# Patient Record
Sex: Male | Born: 1990 | Race: White | Hispanic: No | Marital: Single | Smoking: Never smoker
Health system: Southern US, Community
[De-identification: ages and names within clinical notes are randomized; demographics above are authoritative.]

## PROBLEM LIST (undated history)

## (undated) DIAGNOSIS — I10 Essential (primary) hypertension: Secondary | ICD-10-CM

## (undated) DIAGNOSIS — E7800 Pure hypercholesterolemia, unspecified: Secondary | ICD-10-CM

## (undated) DIAGNOSIS — E119 Type 2 diabetes mellitus without complications: Secondary | ICD-10-CM

---

## 2019-08-01 ENCOUNTER — Other Ambulatory Visit: Payer: Self-pay | Admitting: Family Medicine

## 2019-08-01 ENCOUNTER — Ambulatory Visit
Admission: RE | Admit: 2019-08-01 | Discharge: 2019-08-01 | Disposition: A | Discharge status: Home / Self Care | Payer: Commercial Managed Care - PPO | Source: Ambulatory Visit | Attending: Family Medicine | Admitting: Family Medicine

## 2019-08-01 DIAGNOSIS — M542 Cervicalgia: Secondary | ICD-10-CM

## 2020-09-06 DIAGNOSIS — I10 Essential (primary) hypertension: Secondary | ICD-10-CM | POA: Diagnosis not present

## 2020-09-06 DIAGNOSIS — Z8249 Family history of ischemic heart disease and other diseases of the circulatory system: Secondary | ICD-10-CM | POA: Diagnosis not present

## 2020-09-06 DIAGNOSIS — E7800 Pure hypercholesterolemia, unspecified: Secondary | ICD-10-CM | POA: Diagnosis not present

## 2020-09-06 DIAGNOSIS — Z23 Encounter for immunization: Secondary | ICD-10-CM | POA: Diagnosis not present

## 2020-09-06 DIAGNOSIS — R7303 Prediabetes: Secondary | ICD-10-CM | POA: Diagnosis not present

## 2020-09-12 DIAGNOSIS — E1165 Type 2 diabetes mellitus with hyperglycemia: Secondary | ICD-10-CM | POA: Diagnosis not present

## 2020-12-11 DIAGNOSIS — E1165 Type 2 diabetes mellitus with hyperglycemia: Secondary | ICD-10-CM | POA: Diagnosis not present

## 2020-12-11 DIAGNOSIS — Z8249 Family history of ischemic heart disease and other diseases of the circulatory system: Secondary | ICD-10-CM | POA: Diagnosis not present

## 2020-12-11 DIAGNOSIS — I10 Essential (primary) hypertension: Secondary | ICD-10-CM | POA: Diagnosis not present

## 2020-12-24 DIAGNOSIS — E1165 Type 2 diabetes mellitus with hyperglycemia: Secondary | ICD-10-CM | POA: Diagnosis not present

## 2020-12-24 DIAGNOSIS — Z111 Encounter for screening for respiratory tuberculosis: Secondary | ICD-10-CM | POA: Diagnosis not present

## 2021-03-07 DIAGNOSIS — E7800 Pure hypercholesterolemia, unspecified: Secondary | ICD-10-CM | POA: Diagnosis not present

## 2021-03-07 DIAGNOSIS — I10 Essential (primary) hypertension: Secondary | ICD-10-CM | POA: Diagnosis not present

## 2021-03-07 DIAGNOSIS — E1165 Type 2 diabetes mellitus with hyperglycemia: Secondary | ICD-10-CM | POA: Diagnosis not present

## 2021-03-07 DIAGNOSIS — Z0000 Encounter for general adult medical examination without abnormal findings: Secondary | ICD-10-CM | POA: Diagnosis not present

## 2021-07-26 DIAGNOSIS — M545 Low back pain, unspecified: Secondary | ICD-10-CM | POA: Diagnosis not present

## 2021-09-11 DIAGNOSIS — E1165 Type 2 diabetes mellitus with hyperglycemia: Secondary | ICD-10-CM | POA: Diagnosis not present

## 2021-09-11 DIAGNOSIS — M5441 Lumbago with sciatica, right side: Secondary | ICD-10-CM | POA: Diagnosis not present

## 2021-09-11 DIAGNOSIS — E7800 Pure hypercholesterolemia, unspecified: Secondary | ICD-10-CM | POA: Diagnosis not present

## 2021-09-11 DIAGNOSIS — I10 Essential (primary) hypertension: Secondary | ICD-10-CM | POA: Diagnosis not present

## 2022-03-10 DIAGNOSIS — E7800 Pure hypercholesterolemia, unspecified: Secondary | ICD-10-CM | POA: Diagnosis not present

## 2022-03-10 DIAGNOSIS — I10 Essential (primary) hypertension: Secondary | ICD-10-CM | POA: Diagnosis not present

## 2022-03-10 DIAGNOSIS — E1165 Type 2 diabetes mellitus with hyperglycemia: Secondary | ICD-10-CM | POA: Diagnosis not present

## 2022-03-10 DIAGNOSIS — Z0000 Encounter for general adult medical examination without abnormal findings: Secondary | ICD-10-CM | POA: Diagnosis not present

## 2022-03-10 DIAGNOSIS — M5441 Lumbago with sciatica, right side: Secondary | ICD-10-CM | POA: Diagnosis not present

## 2022-03-11 ENCOUNTER — Other Ambulatory Visit: Payer: Self-pay | Admitting: Family Medicine

## 2022-03-11 ENCOUNTER — Other Ambulatory Visit (HOSPITAL_COMMUNITY): Payer: Self-pay | Admitting: Family Medicine

## 2022-03-11 DIAGNOSIS — Z8249 Family history of ischemic heart disease and other diseases of the circulatory system: Secondary | ICD-10-CM

## 2022-03-11 DIAGNOSIS — E7800 Pure hypercholesterolemia, unspecified: Secondary | ICD-10-CM

## 2022-03-17 ENCOUNTER — Ambulatory Visit (HOSPITAL_BASED_OUTPATIENT_CLINIC_OR_DEPARTMENT_OTHER)
Admission: RE | Admit: 2022-03-17 | Discharge: 2022-03-17 | Disposition: A | Discharge status: Home / Self Care | Payer: Commercial Managed Care - PPO | Source: Ambulatory Visit | Attending: Family Medicine | Admitting: Family Medicine

## 2022-03-17 ENCOUNTER — Encounter (HOSPITAL_BASED_OUTPATIENT_CLINIC_OR_DEPARTMENT_OTHER): Payer: Self-pay

## 2022-03-17 DIAGNOSIS — Z8249 Family history of ischemic heart disease and other diseases of the circulatory system: Secondary | ICD-10-CM | POA: Insufficient documentation

## 2022-03-17 DIAGNOSIS — E7800 Pure hypercholesterolemia, unspecified: Secondary | ICD-10-CM | POA: Insufficient documentation

## 2022-04-13 DIAGNOSIS — G4733 Obstructive sleep apnea (adult) (pediatric): Secondary | ICD-10-CM | POA: Diagnosis not present

## 2022-05-20 DIAGNOSIS — G4733 Obstructive sleep apnea (adult) (pediatric): Secondary | ICD-10-CM | POA: Diagnosis not present

## 2022-06-20 DIAGNOSIS — G4733 Obstructive sleep apnea (adult) (pediatric): Secondary | ICD-10-CM | POA: Diagnosis not present

## 2022-07-20 DIAGNOSIS — G4733 Obstructive sleep apnea (adult) (pediatric): Secondary | ICD-10-CM | POA: Diagnosis not present

## 2022-07-21 DIAGNOSIS — G4733 Obstructive sleep apnea (adult) (pediatric): Secondary | ICD-10-CM | POA: Diagnosis not present

## 2022-08-05 DIAGNOSIS — M5459 Other low back pain: Secondary | ICD-10-CM | POA: Diagnosis not present

## 2022-08-20 DIAGNOSIS — G4733 Obstructive sleep apnea (adult) (pediatric): Secondary | ICD-10-CM | POA: Diagnosis not present

## 2022-08-31 DIAGNOSIS — I10 Essential (primary) hypertension: Secondary | ICD-10-CM | POA: Diagnosis not present

## 2022-08-31 DIAGNOSIS — G4733 Obstructive sleep apnea (adult) (pediatric): Secondary | ICD-10-CM | POA: Diagnosis not present

## 2022-09-23 DIAGNOSIS — E1165 Type 2 diabetes mellitus with hyperglycemia: Secondary | ICD-10-CM | POA: Diagnosis not present

## 2022-09-23 DIAGNOSIS — I10 Essential (primary) hypertension: Secondary | ICD-10-CM | POA: Diagnosis not present

## 2022-09-23 DIAGNOSIS — E7800 Pure hypercholesterolemia, unspecified: Secondary | ICD-10-CM | POA: Diagnosis not present

## 2022-09-23 DIAGNOSIS — G4733 Obstructive sleep apnea (adult) (pediatric): Secondary | ICD-10-CM | POA: Diagnosis not present

## 2022-12-02 DIAGNOSIS — E1165 Type 2 diabetes mellitus with hyperglycemia: Secondary | ICD-10-CM | POA: Diagnosis not present

## 2023-01-20 ENCOUNTER — Emergency Department (HOSPITAL_COMMUNITY)
Admission: EM | Admit: 2023-01-20 | Discharge: 2023-01-20 | Disposition: A | Discharge status: Home / Self Care | Payer: BC Managed Care – PPO | Attending: Emergency Medicine | Admitting: Emergency Medicine

## 2023-01-20 ENCOUNTER — Emergency Department (HOSPITAL_COMMUNITY): Payer: BC Managed Care – PPO

## 2023-01-20 ENCOUNTER — Other Ambulatory Visit: Payer: Self-pay

## 2023-01-20 ENCOUNTER — Encounter (HOSPITAL_COMMUNITY): Payer: Self-pay

## 2023-01-20 DIAGNOSIS — N23 Unspecified renal colic: Secondary | ICD-10-CM | POA: Insufficient documentation

## 2023-01-20 DIAGNOSIS — R1031 Right lower quadrant pain: Secondary | ICD-10-CM | POA: Diagnosis not present

## 2023-01-20 DIAGNOSIS — R109 Unspecified abdominal pain: Secondary | ICD-10-CM | POA: Diagnosis not present

## 2023-01-20 DIAGNOSIS — N201 Calculus of ureter: Secondary | ICD-10-CM | POA: Diagnosis not present

## 2023-01-20 HISTORY — DX: Type 2 diabetes mellitus without complications: E11.9

## 2023-01-20 HISTORY — DX: Essential (primary) hypertension: I10

## 2023-01-20 HISTORY — DX: Pure hypercholesterolemia, unspecified: E78.00

## 2023-01-20 LAB — COMPREHENSIVE METABOLIC PANEL
ALT: 30 U/L (ref 0–44)
AST: 24 U/L (ref 15–41)
Albumin: 3.8 g/dL (ref 3.5–5.0)
Alkaline Phosphatase: 99 U/L (ref 38–126)
Anion gap: 12 (ref 5–15)
BUN: 12 mg/dL (ref 6–20)
CO2: 26 mmol/L (ref 22–32)
Calcium: 9.1 mg/dL (ref 8.9–10.3)
Chloride: 98 mmol/L (ref 98–111)
Creatinine, Ser: 1.18 mg/dL (ref 0.61–1.24)
GFR, Estimated: >60 mL/min (ref >60)
Glucose, Bld: 228 mg/dL — ABNORMAL HIGH (ref 70–99)
Potassium: 4.1 mmol/L (ref 3.5–5.1)
Sodium: 136 mmol/L (ref 135–145)
Total Bilirubin: 0.9 mg/dL (ref 0.3–1.2)
Total Protein: 6.9 g/dL (ref 6.5–8.1)

## 2023-01-20 LAB — URINALYSIS, W/ REFLEX TO CULTURE (INFECTION SUSPECTED)
Bacteria, UA: NONE SEEN
Bilirubin Urine: NEGATIVE
Glucose, UA: >=500 mg/dL — AB
Hgb urine dipstick: NEGATIVE
Ketones, ur: 5 mg/dL — AB
Leukocytes,Ua: NEGATIVE
Nitrite: NEGATIVE
Protein, ur: NEGATIVE mg/dL
Specific Gravity, Urine: 1.02 (ref 1.005–1.030)
pH: 8 (ref 5.0–8.0)

## 2023-01-20 LAB — CBC
HCT: 40.6 % (ref 39.0–52.0)
Hemoglobin: 13.9 g/dL (ref 13.0–17.0)
MCH: 27.2 pg (ref 26.0–34.0)
MCHC: 34.2 g/dL (ref 30.0–36.0)
MCV: 79.5 fL — ABNORMAL LOW (ref 80.0–100.0)
Platelets: 274 10*3/uL (ref 150–400)
RBC: 5.11 MIL/uL (ref 4.22–5.81)
RDW: 13.6 % (ref 11.5–15.5)
WBC: 12.3 10*3/uL — ABNORMAL HIGH (ref 4.0–10.5)
nRBC: 0 % (ref 0.0–0.2)

## 2023-01-20 MED ORDER — MORPHINE SULFATE (PF) 2 MG/ML IV SOLN
2.0000 mg | Freq: Once | INTRAVENOUS | Status: AC
  Administered 2023-01-20: 2 mg via INTRAVENOUS
  Filled 2023-01-20: qty 1

## 2023-01-20 MED ORDER — TAMSULOSIN HCL 0.4 MG PO CAPS: 0.4000 mg | ORAL_CAPSULE | Freq: Every day | ORAL | 0 refills | Status: AC

## 2023-01-20 MED ORDER — OXYCODONE-ACETAMINOPHEN 5-325 MG PO TABS: 1.0000 | ORAL_TABLET | Freq: Four times a day (QID) | ORAL | 0 refills | Status: AC | PRN

## 2023-01-20 MED ORDER — ONDANSETRON HCL 8 MG PO TABS: 8.0000 mg | ORAL_TABLET | Freq: Three times a day (TID) | ORAL | 0 refills | Status: AC | PRN

## 2023-01-20 MED ORDER — ONDANSETRON HCL 4 MG/2ML IJ SOLN
4.0000 mg | Freq: Once | INTRAMUSCULAR | Status: AC
  Administered 2023-01-20: 4 mg via INTRAVENOUS
  Filled 2023-01-20: qty 2

## 2023-01-20 MED ORDER — KETOROLAC TROMETHAMINE 30 MG/ML IJ SOLN
30.0000 mg | Freq: Once | INTRAMUSCULAR | Status: AC
  Administered 2023-01-20: 30 mg via INTRAVENOUS
  Filled 2023-01-20: qty 1

## 2023-01-20 NOTE — ED Provider Notes (Signed)
[facility] Provider Note   CSN: [id number] Arrival date & time: 01/20/23  [id number]     History  Chief Complaint  Patient presents with   Abdominal Pain    Samuel Welch is a 32 y.o. male.  HPI   32 year old male presents today complaining of right lower quadrant pain and.  He states that it began yesterday around 11:00.  He had some nausea and vomiting afterwards.  He reports that he did eat his lunch.  He denies any pain or blood in urine.  He has continued to have pain in the right lower quadrant that has been fairly steady.  He states that he wants to lay in 1 position but is not comfortable and moves around.  He has not had any similar symptoms in the past.  He denies fever, chills, diarrhea  Home Medications Prior to Admission medications   Medication Sig Start Date End Date Taking? Authorizing Provider  ondansetron (ZOFRAN) 8 MG tablet Take 1 tablet (8 mg total) by mouth every 8 (eight) hours as needed for nausea or vomiting. 01/20/23  Yes Pattricia Boss, MD  oxyCODONE-acetaminophen (PERCOCET/ROXICET) 5-325 MG tablet Take 1 tablet by mouth every 6 (six) hours as needed for severe pain. 01/20/23  Yes Pattricia Boss, MD  tamsulosin (FLOMAX) 0.4 MG CAPS capsule Take 1 capsule (0.4 mg total) by mouth daily. 01/20/23  Yes Pattricia Boss, MD      Allergies    Patient has no known allergies.    Review of Systems   Review of Systems  Physical Exam Updated Vital Signs BP 130/67   Pulse 80   Temp 98.4 F (36.9 C) (Oral)   Resp 18   Ht 1.803 m (5\' 11" )   Wt (!) 145.2 kg   SpO2 96%   BMI 44.63 kg/m  Physical Exam Vitals and nursing note reviewed.  Constitutional:      Appearance: He is well-developed.  HENT:     Head: Normocephalic.     Mouth/Throat:     Mouth: Mucous membranes are moist.  Cardiovascular:     Rate and Rhythm: Normal rate and regular rhythm.     Heart sounds: Normal heart sounds.  Pulmonary:     Effort: Pulmonary  effort is normal.     Breath sounds: Normal breath sounds.  Abdominal:     General: Abdomen is flat. Bowel sounds are decreased.     Palpations: Abdomen is soft.     Tenderness: There is abdominal tenderness in the right lower quadrant.  Genitourinary:    Penis: Normal.      Testes:        Right: Tenderness present. Mass or swelling not present.        Left: Mass, tenderness or swelling not present.  Skin:    General: Skin is warm and dry.     Capillary Refill: Capillary refill takes less than 2 seconds.  Neurological:     General: No focal deficit present.     Mental Status: He is alert.     ED Results / Procedures / Treatments   Labs (all labs ordered are listed, but only abnormal results are displayed) Labs Reviewed  URINALYSIS, W/ REFLEX TO CULTURE (INFECTION SUSPECTED) - Abnormal; Notable for the following components:      Result Value   Glucose, UA >=500 (*)    Ketones, ur 5 (*)    All other components within normal limits  CBC - Abnormal; Notable for the  following components:   WBC 12.3 (*)    MCV 79.5 (*)    All other components within normal limits  COMPREHENSIVE METABOLIC PANEL - Abnormal; Notable for the following components:   Glucose, Bld 228 (*)    All other components within normal limits    EKG None  Radiology CT ABDOMEN PELVIS WO CONTRAST  Result Date: 01/20/2023 CLINICAL DATA:  Right lower quadrant abdominal pain. Nausea and vomiting last 24 hours EXAM: CT ABDOMEN AND PELVIS WITHOUT CONTRAST TECHNIQUE: Multidetector CT imaging of the abdomen and pelvis was performed following the standard protocol without IV contrast. RADIATION DOSE REDUCTION: This exam was performed according to the departmental dose-optimization program which includes automated exposure control, adjustment of the mA and/or kV according to patient size and/or use of iterative reconstruction technique. COMPARISON:  None Available. FINDINGS: Lower chest: Lung bases are grossly clear.  No  pleural effusion. Hepatobiliary: On this non IV contrast exam note is made of diffuse fatty liver infiltration. Gallbladder is nondilated. Pancreas: Unremarkable. No pancreatic ductal dilatation or surrounding inflammatory changes. Spleen: Normal in size without focal abnormality. Adrenals/Urinary Tract: The adrenal glands are preserved. No abnormal calcifications seen within the left kidney nor along the course of the left ureter. The right kidney is slightly enlarged with some adjacent stranding and mild collecting system dilatation down to the level of the mid pelvic ureter where there is a 4 mm ureteral stone. No additional ureteral stones. There are 2 separate punctate intrarenal collecting system stones on the right. Preserved contour to the underdistended urinary bladder. Stomach/Bowel: On this non oral contrast exam, the large bowel has a normal course and caliber with scattered stool. Normal appendix. Stomach and small bowel is nondilated. Vascular/Lymphatic: No significant vascular findings are present. No enlarged abdominal or pelvic lymph nodes. Reproductive: Prostate is unremarkable. Other: Small fat containing right inguinal hernia. Slight rectus muscle diastasis with protuberance of fat at the umbilicus. Musculoskeletal: Mild degenerative changes along the spine and pelvis. There is a transitional lumbosacral segment. Posterior osteophytes seen along lower lumbar spine with some canal encroachment IMPRESSION: 4 mm distal right ureteral stone with mild collecting system dilatation. 2 separate additional intrarenal right-sided stones. Fatty liver infiltration. Normal appendix Electronically Signed   By: Jill Side M.D.   On: 01/20/2023 09:48    Procedures Procedures    Medications Ordered in ED Medications  ketorolac (TORADOL) 30 MG/ML injection 30 mg (has no administration in time range)  morphine (PF) 2 MG/ML injection 2 mg (2 mg Intravenous Given 01/20/23 0817)  ondansetron (ZOFRAN)  injection 4 mg (4 mg Intravenous Given 01/20/23 0817)    ED Course/ Medical Decision Making/ A&P Clinical Course as of 01/20/23 1008  Wed Jan 20, 2023  0905 [facility] reviewed interpreted significant for mild leukocytosis of 12,300 [DR]  [id number] metabolic panel reviewed interpreted significant for mild hyperglycemia with glucose of 228 with patient with known diabetes [DR]  0959 CT reviewed interpreted significant for right ureteral stone and 2 right intra renal stones. Radiologist interpretation notes 4 mm distal right ureteral stone with mild collecting system dilatation [DR]    Clinical Course User Index [DR] Pattricia Boss, MD                             Medical Decision Making Amount and/or Complexity of Data Reviewed Labs: ordered. Radiology: ordered.  Risk Prescription drug management.  32 year old male presents today complaining of right lower quadrant pain. Differential diagnosis  includes but is not limited to appendicitis, UTI, colitis, renal colic, testicular torsion, epididymitis, With some pain improvement here in the ED after morphine. CT reviewed and ureteral stone noted. Treated with Toradol We have discussed adequate hydration pain management, return precautions and need for follow-up        Final Clinical Impression(s) / ED Diagnoses Final diagnoses:  Right ureteral stone  Ureteral colic    Rx / DC Orders ED Discharge Orders          Ordered    oxyCODONE-acetaminophen (PERCOCET/ROXICET) 5-325 MG tablet  Every 6 hours PRN        01/20/23 1005    ondansetron (ZOFRAN) 8 MG tablet  Every 8 hours PRN        01/20/23 1005    tamsulosin (FLOMAX) 0.4 MG CAPS capsule  Daily        01/20/23 1005              Pattricia Boss, MD 01/20/23 1008

## 2023-01-20 NOTE — ED Triage Notes (Signed)
Pt present to ED with c/o abdominal pain, n/v times 24hrs.

## 2023-01-25 DIAGNOSIS — N201 Calculus of ureter: Secondary | ICD-10-CM | POA: Diagnosis not present

## 2023-03-15 DIAGNOSIS — Z0000 Encounter for general adult medical examination without abnormal findings: Secondary | ICD-10-CM | POA: Diagnosis not present

## 2023-03-31 DIAGNOSIS — E7800 Pure hypercholesterolemia, unspecified: Secondary | ICD-10-CM | POA: Diagnosis not present

## 2023-03-31 DIAGNOSIS — E1165 Type 2 diabetes mellitus with hyperglycemia: Secondary | ICD-10-CM | POA: Diagnosis not present

## 2023-03-31 DIAGNOSIS — I10 Essential (primary) hypertension: Secondary | ICD-10-CM | POA: Diagnosis not present

## 2023-05-07 DIAGNOSIS — E119 Type 2 diabetes mellitus without complications: Secondary | ICD-10-CM | POA: Diagnosis not present

## 2023-05-07 DIAGNOSIS — H35033 Hypertensive retinopathy, bilateral: Secondary | ICD-10-CM | POA: Diagnosis not present

## 2023-07-21 DIAGNOSIS — G4733 Obstructive sleep apnea (adult) (pediatric): Secondary | ICD-10-CM | POA: Diagnosis not present

## 2023-09-13 DIAGNOSIS — Z23 Encounter for immunization: Secondary | ICD-10-CM | POA: Diagnosis not present

## 2023-09-13 DIAGNOSIS — I10 Essential (primary) hypertension: Secondary | ICD-10-CM | POA: Diagnosis not present

## 2023-09-13 DIAGNOSIS — E7800 Pure hypercholesterolemia, unspecified: Secondary | ICD-10-CM | POA: Diagnosis not present

## 2023-09-13 DIAGNOSIS — G4733 Obstructive sleep apnea (adult) (pediatric): Secondary | ICD-10-CM | POA: Diagnosis not present

## 2023-09-13 DIAGNOSIS — E119 Type 2 diabetes mellitus without complications: Secondary | ICD-10-CM | POA: Diagnosis not present

## 2023-10-20 IMAGING — CT CT CARDIAC CORONARY ARTERY CALCIUM SCORE
3 series · 14 of 20 positions shown, 16 images · non-contrast
Comparison: None.
COMPARISON: None.

Addendum:
CLINICAL DATA: Hyperlipidemia and hypertension.  Former smoker.
CLINICAL DATA: Cardiovascular Disease Risk stratification

EXAM:
Coronary Calcium Score
TECHNIQUE: A gated, non-contrast computed tomography scan of the heart was
performed using 3mm slice thickness. Axial images were analyzed on a
dedicated workstation. Calcium scoring of the coronary arteries was
performed using the Agatston method.

[Series 2: ax lung · axial · 0.98mm/px · z∈[+70,+182]mm · 5 of 86 slices shown]
[im 15/86  lung]
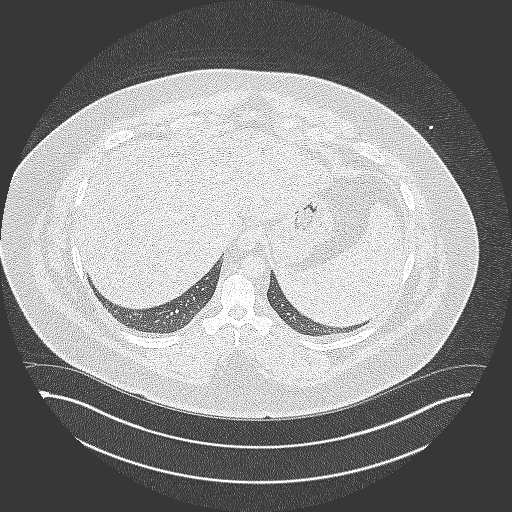
[im 29/86  lung]
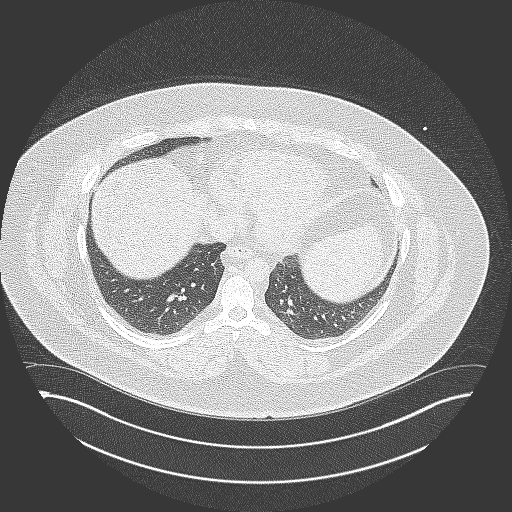
[im 43/86  lung]
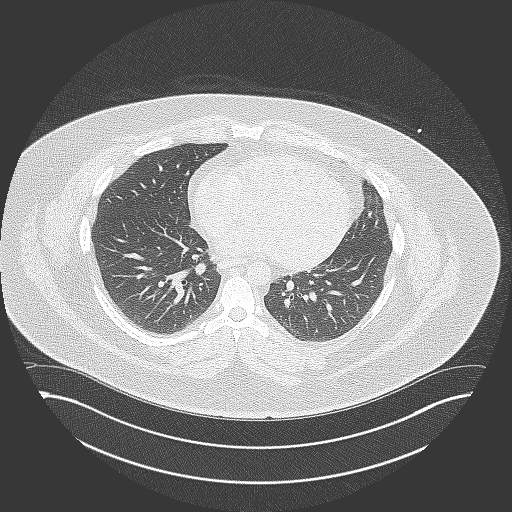
[im 57/86  lung]
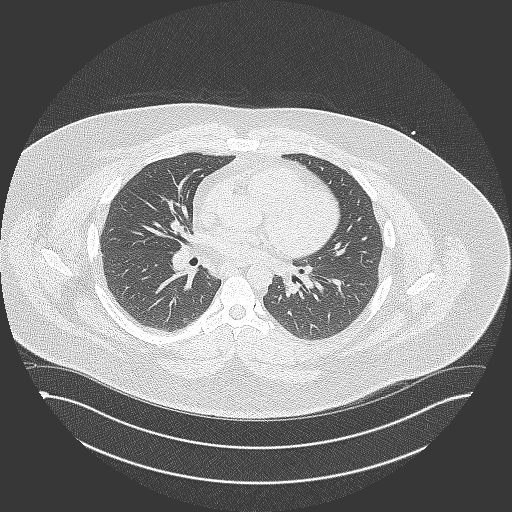
[im 71/86  lung]
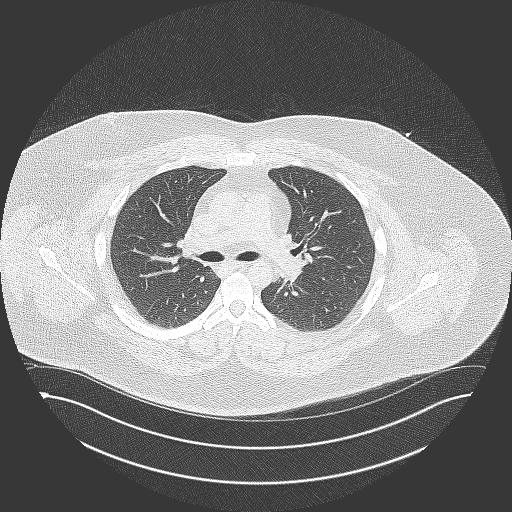

[Series 3: cascseq 3.0 sa36 70% (id) · axial · 0.40mm/px · z∈[+85,+169]mm · 3 of 57 slices shown]
[im 15/57  vessel]
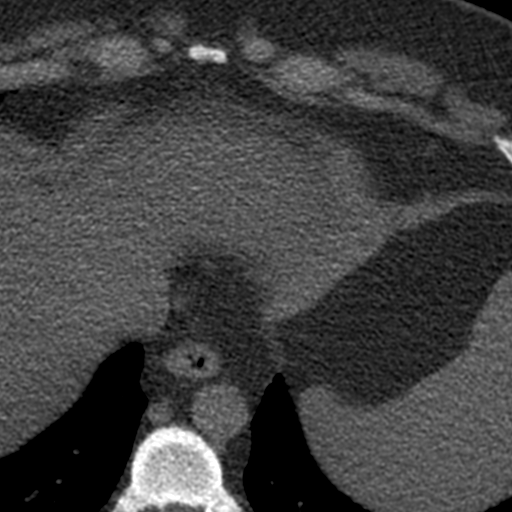
[im 29/57  vessel]
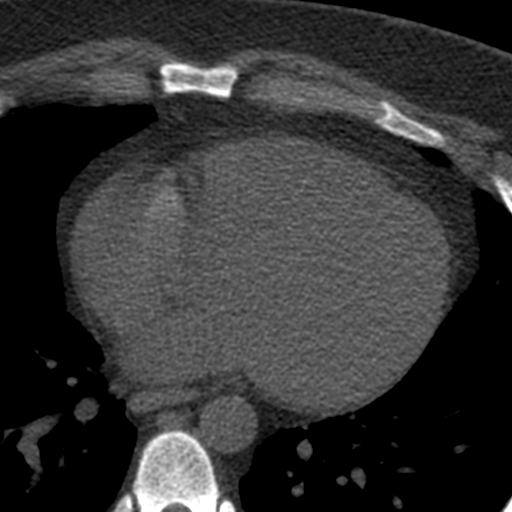
[im 43/57  vessel]
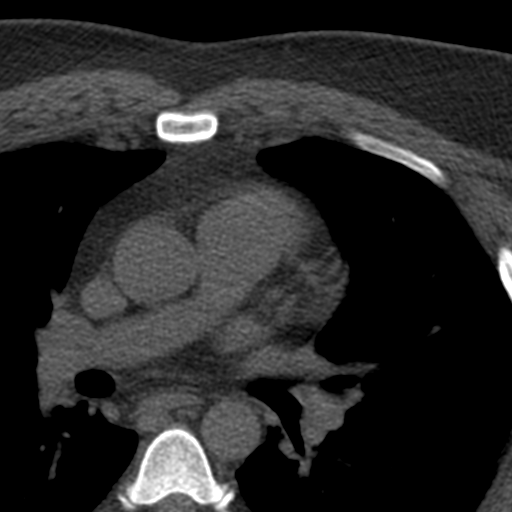

[Series 4: ax st · axial · 0.98mm/px · z∈[+66,+186]mm · 6 of 86 slices shown, 8 images]
[im 13/86  vessel]
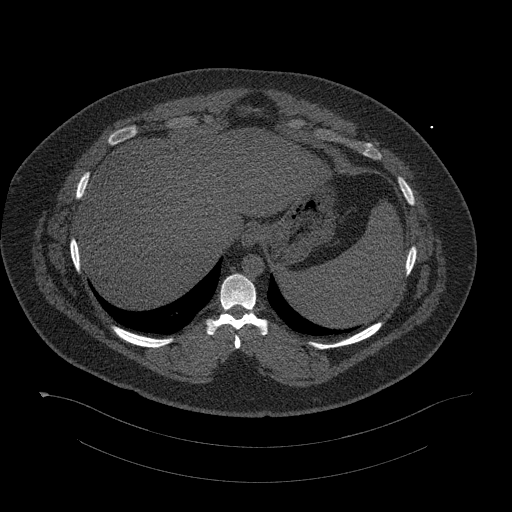
[im 13/86  lung]
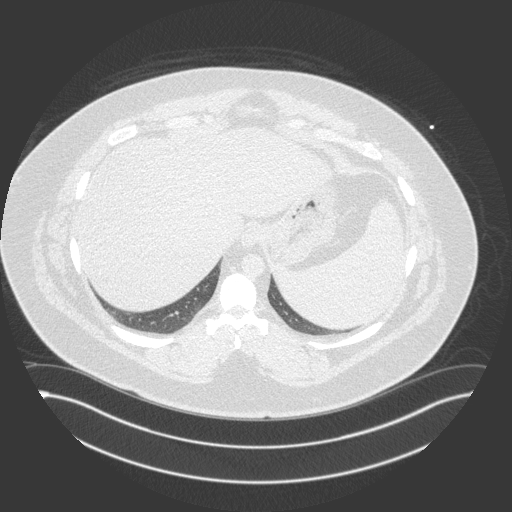
[im 25/86  vessel]
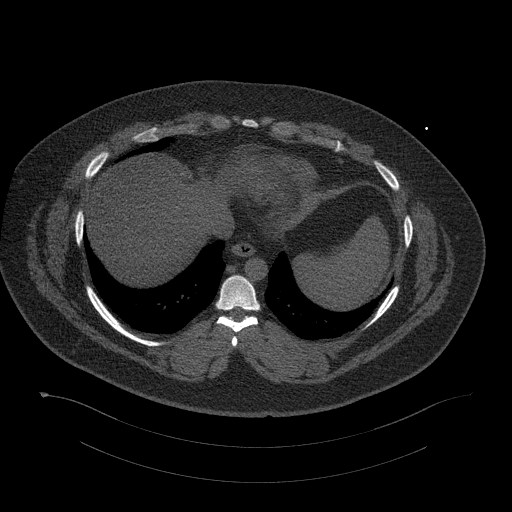
[im 37/86  vessel]
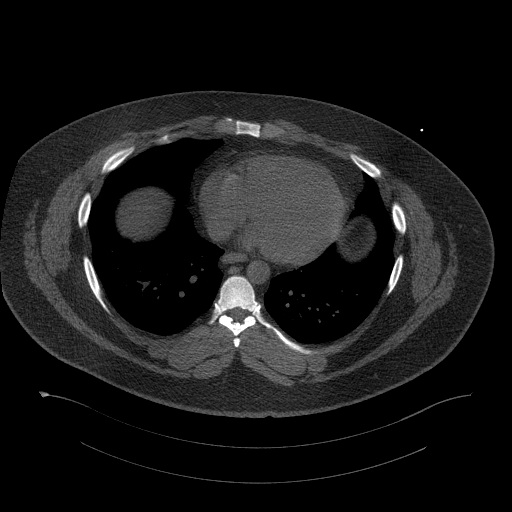
[im 49/86  vessel]
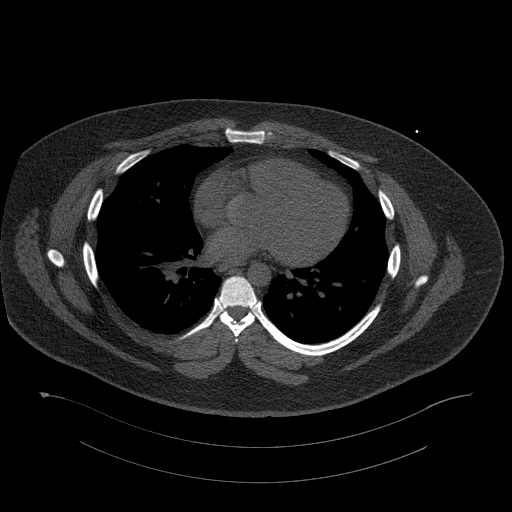
[im 61/86  vessel]
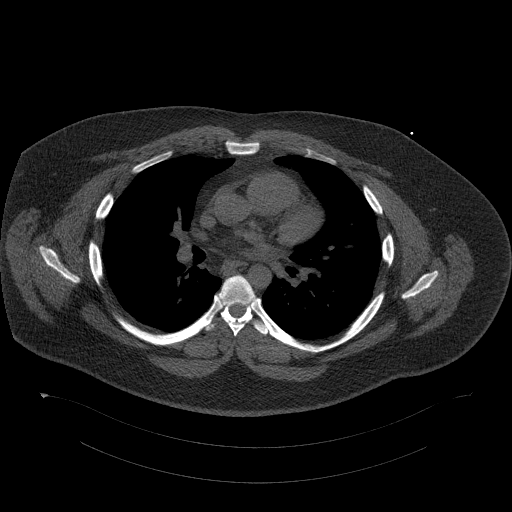
[im 61/86  lung]
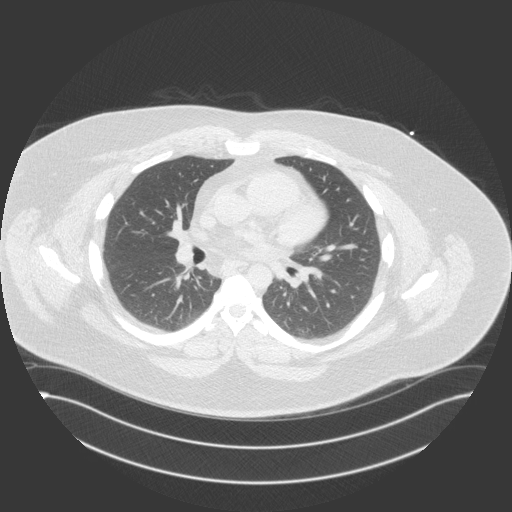
[im 73/86  vessel]
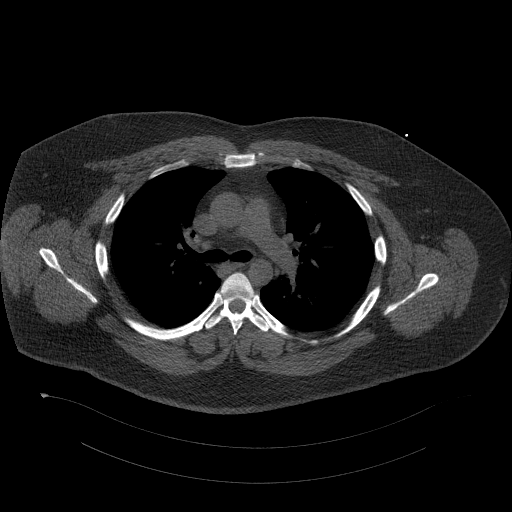

[14 of 20 positions shown; findings below may reference images not displayed]

EXAM:
OVER-READ INTERPRETATION  CT CHEST

The following report is an over-read performed by radiologist Dr.
Yasushige Renfroe [[email or web address]] on 03/17/2022. This over-read
does not include interpretation of cardiac or coronary anatomy or
pathology. The coronary calcium score interpretation by the
cardiologist is attached.
FINDINGS: Cardiovascular: There are no significant extracardiac vascular
findings.

Mediastinum/Nodes: There are no enlarged lymph nodes within the
visualized mediastinum.

Lungs/Pleura: Left lower lobe subpleural 4 mm pulmonary nodule
(axial series 2, image 52). On sagittal series 603, image 162 this
is somewhat horizontal and linear and may represent subpleural
scarring.

Upper abdomen: No significant findings in the visualized upper
abdomen.

Musculoskeletal/Chest wall: Incidental note of mild right and
minimal left gynecomastia. No significant degenerative disc change
of the visualized midthoracic spine.
IMPRESSION: Incidental note of posterior left lower lobe 4 mm nodular density
which may represent minimal subpleural scarring. No follow-up needed
if patient is low-risk.This recommendation follows the consensus
statement: Guidelines for Management of Incidental Pulmonary Nodules
Detected on CT Images: From the [HOSPITAL] 9980; Radiology
FINDINGS: Coronary arteries: Normal left origins.  Right origin not well seen.

Coronary Calcium Score:

Left main: 0

Left anterior descending artery: 0

Left circumflex artery: 0

Right coronary artery: 0

Total: 0

Percentile: 0

Pericardium: Normal.

Aorta: Normal caliber of ascending aorta. No aortic atherosclerosis
noted.

Non-cardiac: See separate report from [REDACTED].
IMPRESSION: Coronary calcium score of 0. This was 0 percentile for age-, race-,
and sex-matched controls.



If CAC=0, it is reasonable to withhold statin therapy and reassess
in 5 to 10 years, as long as higher risk conditions are absent
(diabetes mellitus, family history of premature CHD in first degree
relatives (males <55 years; females <65 years), cigarette smoking,
or LDL >=190 mg/dL).

If CAC is 1 to 99, it is reasonable to initiate statin therapy for
patients >=55 years of age.

If CAC is >=100 or >=75th percentile, it is reasonable to initiate
statin therapy at any age.

Cardiology referral should be considered for patients with CAC
scores >=400 or >=75th percentile.

*4585 AHA/ACC/AACVPR/AAPA/ABC/MOJID/PAPADELLA/CATALIN/Zygfryt/KOWALCZYK/BAZE/SISQO
Guideline on the Management of Blood Cholesterol: A Report of the
American College of Cardiology/American Heart Association Task Force
on Clinical Practice Guidelines. J Am Coll Cardiol.
3588;73(24):7433-7399.

*** End of Addendum ***
EXAM:
OVER-READ INTERPRETATION  CT CHEST

The following report is an over-read performed by radiologist Dr.
Yasushige Renfroe [[email or web address]] on 03/17/2022. This over-read
does not include interpretation of cardiac or coronary anatomy or
pathology. The coronary calcium score interpretation by the
cardiologist is attached.
FINDINGS: Cardiovascular: There are no significant extracardiac vascular
findings.

Mediastinum/Nodes: There are no enlarged lymph nodes within the
visualized mediastinum.

Lungs/Pleura: Left lower lobe subpleural 4 mm pulmonary nodule
(axial series 2, image 52). On sagittal series 603, image 162 this
is somewhat horizontal and linear and may represent subpleural
scarring.

Upper abdomen: No significant findings in the visualized upper
abdomen.

Musculoskeletal/Chest wall: Incidental note of mild right and
minimal left gynecomastia. No significant degenerative disc change
of the visualized midthoracic spine.
IMPRESSION: Incidental note of posterior left lower lobe 4 mm nodular density
which may represent minimal subpleural scarring. No follow-up needed
if patient is low-risk.This recommendation follows the consensus
statement: Guidelines for Management of Incidental Pulmonary Nodules
Detected on CT Images: From the [HOSPITAL] 9980; Radiology

## 2024-03-30 DIAGNOSIS — E119 Type 2 diabetes mellitus without complications: Secondary | ICD-10-CM | POA: Diagnosis not present

## 2024-03-30 DIAGNOSIS — E7800 Pure hypercholesterolemia, unspecified: Secondary | ICD-10-CM | POA: Diagnosis not present

## 2024-03-30 DIAGNOSIS — I10 Essential (primary) hypertension: Secondary | ICD-10-CM | POA: Diagnosis not present

## 2024-03-30 DIAGNOSIS — E1165 Type 2 diabetes mellitus with hyperglycemia: Secondary | ICD-10-CM | POA: Diagnosis not present

## 2024-03-30 DIAGNOSIS — Z0000 Encounter for general adult medical examination without abnormal findings: Secondary | ICD-10-CM | POA: Diagnosis not present

## 2024-06-09 DIAGNOSIS — H35033 Hypertensive retinopathy, bilateral: Secondary | ICD-10-CM | POA: Diagnosis not present

## 2024-06-09 DIAGNOSIS — E119 Type 2 diabetes mellitus without complications: Secondary | ICD-10-CM | POA: Diagnosis not present

## 2024-07-27 DIAGNOSIS — G4733 Obstructive sleep apnea (adult) (pediatric): Secondary | ICD-10-CM | POA: Diagnosis not present

## 2024-09-29 DIAGNOSIS — I10 Essential (primary) hypertension: Secondary | ICD-10-CM | POA: Diagnosis not present

## 2024-09-29 DIAGNOSIS — E7800 Pure hypercholesterolemia, unspecified: Secondary | ICD-10-CM | POA: Diagnosis not present

## 2024-09-29 DIAGNOSIS — G4733 Obstructive sleep apnea (adult) (pediatric): Secondary | ICD-10-CM | POA: Diagnosis not present

## 2024-09-29 DIAGNOSIS — E1165 Type 2 diabetes mellitus with hyperglycemia: Secondary | ICD-10-CM | POA: Diagnosis not present
